# Patient Record
Sex: Female | Born: 1965 | Race: Black or African American | Hispanic: No | Marital: Single | Smoking: Never smoker
Health system: Southern US, Community
[De-identification: ages and names within clinical notes are randomized; demographics above are authoritative.]

## PROBLEM LIST (undated history)

## (undated) DIAGNOSIS — I10 Essential (primary) hypertension: Secondary | ICD-10-CM

## (undated) DIAGNOSIS — I509 Heart failure, unspecified: Secondary | ICD-10-CM

## (undated) DIAGNOSIS — E559 Vitamin D deficiency, unspecified: Secondary | ICD-10-CM

## (undated) HISTORY — PX: CHOLECYSTECTOMY: SHX55

## (undated) HISTORY — PX: ABDOMINAL HYSTERECTOMY: SHX81

## (undated) HISTORY — DX: Vitamin D deficiency, unspecified: E55.9

---

## 2018-09-14 ENCOUNTER — Encounter (HOSPITAL_COMMUNITY): Payer: Self-pay | Admitting: Emergency Medicine

## 2018-09-14 ENCOUNTER — Emergency Department (HOSPITAL_COMMUNITY): Payer: Medicaid Other

## 2018-09-14 ENCOUNTER — Emergency Department (HOSPITAL_COMMUNITY)
Admission: EM | Admit: 2018-09-14 | Discharge: 2018-09-14 | Disposition: A | Discharge status: Home / Self Care | Payer: Medicaid Other | Attending: Emergency Medicine | Admitting: Emergency Medicine

## 2018-09-14 DIAGNOSIS — R51 Headache: Secondary | ICD-10-CM | POA: Insufficient documentation

## 2018-09-14 DIAGNOSIS — R519 Headache, unspecified: Secondary | ICD-10-CM

## 2018-09-14 DIAGNOSIS — R079 Chest pain, unspecified: Secondary | ICD-10-CM | POA: Diagnosis present

## 2018-09-14 DIAGNOSIS — I110 Hypertensive heart disease with heart failure: Secondary | ICD-10-CM | POA: Diagnosis not present

## 2018-09-14 DIAGNOSIS — I509 Heart failure, unspecified: Secondary | ICD-10-CM | POA: Insufficient documentation

## 2018-09-14 DIAGNOSIS — R0789 Other chest pain: Secondary | ICD-10-CM

## 2018-09-14 DIAGNOSIS — I10 Essential (primary) hypertension: Secondary | ICD-10-CM

## 2018-09-14 HISTORY — DX: Heart failure, unspecified: I50.9

## 2018-09-14 HISTORY — DX: Essential (primary) hypertension: I10

## 2018-09-14 LAB — URINALYSIS, ROUTINE W REFLEX MICROSCOPIC
Bilirubin Urine: NEGATIVE
Glucose, UA: NEGATIVE mg/dL
Hgb urine dipstick: NEGATIVE
Ketones, ur: NEGATIVE mg/dL
Nitrite: NEGATIVE
Protein, ur: NEGATIVE mg/dL
Specific Gravity, Urine: 1.008 (ref 1.005–1.030)
pH: 9 — ABNORMAL HIGH (ref 5.0–8.0)

## 2018-09-14 LAB — BASIC METABOLIC PANEL
Anion gap: 10 (ref 5–15)
BUN: 9 mg/dL (ref 6–20)
CALCIUM: 9 mg/dL (ref 8.9–10.3)
CO2: 22 mmol/L (ref 22–32)
CREATININE: 0.59 mg/dL (ref 0.44–1.00)
Chloride: 110 mmol/L (ref 98–111)
GFR calc Af Amer: >60 mL/min (ref >60)
Glucose, Bld: 93 mg/dL (ref 70–99)
POTASSIUM: 4.1 mmol/L (ref 3.5–5.1)
Sodium: 142 mmol/L (ref 135–145)

## 2018-09-14 LAB — CBC
HCT: 32 % — ABNORMAL LOW (ref 36.0–46.0)
Hemoglobin: 10.3 g/dL — ABNORMAL LOW (ref 12.0–15.0)
MCH: 26.6 pg (ref 26.0–34.0)
MCHC: 32.2 g/dL (ref 30.0–36.0)
MCV: 82.7 fL (ref 78.0–100.0)
Platelets: 197 K/uL (ref 150–400)
RBC: 3.87 MIL/uL (ref 3.87–5.11)
RDW: 12.9 % (ref 11.5–15.5)
WBC: 4.8 K/uL (ref 4.0–10.5)

## 2018-09-14 LAB — I-STAT TROPONIN, ED: Troponin i, poc: 0 ng/mL (ref 0.00–0.08)

## 2018-09-14 MED ORDER — HYDRALAZINE HCL 20 MG/ML IJ SOLN
10.0000 mg | Freq: Once | INTRAMUSCULAR | Status: AC
  Administered 2018-09-14: 10 mg via INTRAVENOUS
  Filled 2018-09-14: qty 1

## 2018-09-14 MED ORDER — HYDROCHLOROTHIAZIDE 25 MG PO TABS: 25.0000 mg | ORAL_TABLET | Freq: Every day | ORAL | 0 refills | Status: AC

## 2018-09-14 NOTE — ED Notes (Signed)
Pt ambulatory to bathroom with no reported issues. 

## 2018-09-14 NOTE — ED Provider Notes (Signed)
[facility] EMERGENCY DEPARTMENT Provider Note   CSN: [id number] Arrival date & time: 09/14/18  1101     History   Chief Complaint Chief Complaint  Patient presents with  . Chest Pain    HPI Ashley Mitchell is a 52 y.o. female with history of hypertension and CHF presents for evaluation of acute onset, intermittent left-sided chest pains and headaches for 1 year.  She states that approximately twice weekly she will experience a sharp throbbing headache to the occipital region and sometimes the crown which radiates down the neck.  These will come on gradually and resolve on their own.  She also notes stabbing pain to the left side of the chest which she will experience for a few seconds before resolving.  She does note associated lightheadedness and diaphoresis from this.  Has not had chest pain for 2 weeks.  Went to urgent care earlier today for evaluation of left hand numbness and tingling and left-sided neck pain.  Sent to the ED for further evaluation because she was found to be hypertensive.  She states that she was prescribed amlodipine a year ago but has not been taking it because she does not like how it makes her feel.  Has also been on lisinopril but developed a cough and this was discontinued.  Presently denies any chest pain, headache, weakness.  No vision changes, fevers, chills, abdominal pain.    The history is provided by the patient.    Past Medical History:  Diagnosis Date  . CHF (congestive heart failure) (HCC)   . Hypertension     There are no active problems to display for this patient.   Past Surgical History:  Procedure Laterality Date  . ABDOMINAL HYSTERECTOMY    . CHOLECYSTECTOMY       OB History   None      Home Medications    Prior to Admission medications   Medication Sig Start Date End Date Taking? Authorizing Provider  hydrochlorothiazide (HYDRODIURIL) 25 MG tablet Take 1 tablet (25 mg total) by mouth daily. 09/14/18 10/14/18   Jeanie Sewer, PA-C    Family History History reviewed. No pertinent family history.  Social History Social History   Tobacco Use  . Smoking status: Never Smoker  . Smokeless tobacco: Never Used  Substance Use Topics  . Alcohol use: Never    Frequency: Never  . Drug use: Never     Allergies   Aspirin; Lisinopril; and Vicodin [hydrocodone-acetaminophen]   Review of Systems Review of Systems  Constitutional: Negative for chills and fever.  Respiratory: Negative for shortness of breath.   Cardiovascular: Negative for chest pain.  Gastrointestinal: Negative for abdominal pain, nausea and vomiting.  Musculoskeletal: Negative for back pain and neck pain.  Neurological: Positive for numbness (left hand) and headaches. Negative for weakness.  All other systems reviewed and are negative.    Physical Exam Updated Vital Signs BP (!) 169/82   Pulse 68   Temp 98.4 F (36.9 C) (Oral)   Resp 18   Ht 5' (1.524 m)   Wt 63 kg   SpO2 100%   BMI 27.15 kg/m   Physical Exam  Constitutional: She is oriented to person, place, and time. She appears well-developed and well-nourished. No distress.  HENT:  Head: Normocephalic and atraumatic.  Eyes: Conjunctivae are normal. Right eye exhibits no discharge. Left eye exhibits no discharge.  Neck: Normal range of motion. Neck supple. No JVD present. No tracheal deviation present.  No midline  spine tenderness, left-sided para cervical muscle tenderness in the trapezius distribution noted.  No deformity, crepitus, or step-off noted.  Cardiovascular: Normal rate and regular rhythm.  Pulses:      Carotid pulses are 2+ on the right side, and 2+ on the left side.      Radial pulses are 2+ on the right side, and 2+ on the left side.       Dorsalis pedis pulses are 2+ on the right side, and 2+ on the left side.       Posterior tibial pulses are 2+ on the right side, and 2+ on the left side.  Pulmonary/Chest: Effort normal and breath sounds  normal. No accessory muscle usage. No respiratory distress.  Abdominal: Soft. Bowel sounds are normal. She exhibits no distension. There is no tenderness.  Musculoskeletal: She exhibits no edema.       Right lower leg: Normal. She exhibits no tenderness and no edema.       Left lower leg: Normal. She exhibits no tenderness and no edema.  Neurological: She is alert and oriented to person, place, and time. No cranial nerve deficit.  Mental Status:  Alert, thought content appropriate, able to give a coherent history. Speech fluent without evidence of aphasia. Able to follow 2 step commands without difficulty.  Cranial Nerves:  II:  Peripheral visual fields grossly normal, pupils equal, round, reactive to light III,IV, VI: ptosis not present, extra-ocular motions intact bilaterally  V,VII: smile symmetric, facial light touch sensation equal VIII: hearing grossly normal to voice  X: uvula elevates symmetrically  XI: bilateral shoulder shrug symmetric and strong XII: midline tongue extension without fassiculations Motor:  Normal tone. 5/5 strength of BUE and BLE major muscle groups including strong and equal grip strength and dorsiflexion/plantar flexion Sensory: light touch normal in all extremities. Cerebellar: normal finger-to-nose with bilateral upper extremities Gait: normal gait and balance. Able to walk on toes and heels with ease.     Skin: Skin is dry. No erythema.  Psychiatric: She has a normal mood and affect. Her behavior is normal.  Nursing note and vitals reviewed.    ED Treatments / Results  Labs (all labs ordered are listed, but only abnormal results are displayed) Labs Reviewed  CBC - Abnormal; Notable for the following components:      Result Value   Hemoglobin 10.3 (*)    HCT 32.0 (*)    All other components within normal limits  URINALYSIS, ROUTINE W REFLEX MICROSCOPIC - Abnormal; Notable for the following components:   Color, Urine STRAW (*)    pH 9.0 (*)     Leukocytes, UA TRACE (*)    Bacteria, UA RARE (*)    All other components within normal limits  URINE CULTURE  BASIC METABOLIC PANEL  I-STAT TROPONIN, ED    EKG EKG Interpretation  Date/Time:  Monday September 14 2018 11:15:13 EDT Ventricular Rate:  56 PR Interval:    QRS Duration: 87 QT Interval:  439 QTC Calculation: 424 R Axis:   52 Text Interpretation:  Sinus rhythm Consider left atrial enlargement Nonspecific T abnrm, anterolateral leads ST elev, probable normal early repol pattern Baseline wander in lead(s) V4 V5 V6 Abnormal ekg Confirmed by Gerhard Munch [phone]) on 09/14/2018 11:20:02 AM Also confirmed by Gerhard Munch ([id number]), editor Hermleigh, Tamera Punt ([id number])  on 09/14/2018 3:06:47 PM   Radiology Dg Chest 2 View  Result Date: 09/14/2018 CLINICAL DATA:  Chest and lt arm pain since early am,hypertensive EXAM: CHEST - 2 VIEW  COMPARISON:  None. FINDINGS: Heart size is upper normal. Overall cardiomediastinal silhouette is within normal limits. Lungs are clear. No pleural effusion or pneumothorax seen. Osseous structures about the chest are unremarkable. IMPRESSION: No active cardiopulmonary disease. No evidence of pneumonia or pulmonary edema. Electronically Signed   By: Bary RichardStan  Maynard M.D.   On: 09/14/2018 13:08    Procedures Procedures (including critical care time)  Medications Ordered in ED Medications  hydrALAZINE (APRESOLINE) injection 10 mg (10 mg Intravenous Given 09/14/18 1153)     Initial Impression / Assessment and Plan / ED Course  I have reviewed the triage vital signs and the nursing notes.  Pertinent labs & imaging results that were available during my care of the patient were reviewed by me and considered in my medical decision making (see chart for details).     Patient presents for evaluation of hypertension.  She has had intermittent chest pains and headaches for the past year.  She is afebrile, hypertensive in the ED with improvement after  administration of hydralazine.  No focal neurologic deficits on examination and she denies any headache or chest pain at this time.  Has not experienced chest pain in the past 2 weeks.  Lab work reviewed by me shows no leukocytosis, mild anemia.  No metabolic derangements, creatinine within normal limits.  Troponin is negative.  Since she has not experienced chest pain for 2 weeks now I do not see the need to repeat troponin.  Doubt ACS/MI.  EKG shows normal sinus rhythm, no ischemic changes noted.  Chest x-ray shows no active cardiopulmonary disease or evidence of pneumonia or edema.  UA shows trace leukocytes and rare bacteria.  She has no urinary symptoms.  Will culture urine and elect to hold off on treatment of possible UTI at this time. No evidence of end-organ damage. Doubt CVA, ICH, SAH, or AKI.  Significant improvement in blood pressure after hydralazine.  She has had lisinopril and amlodipine which she has not tolerated in the past.  States she has not been on a blood pressure medication for several years.  Will discharge with HCTZ.  She has follow-up scheduled with a PCP in 1 week.  Discussed strict ED return precautions. Pt and patient's cousin verbalized understanding of and agreement with plan and patient is safe for discharge home at this time.  Patient seen and evaluated by Dr. Jeraldine LootsLockwood who agrees with assessment and plan at this time.  Final Clinical Impressions(s) / ED Diagnoses   Final diagnoses:  Atypical chest pain  Intermittent headache  Hypertension, unspecified type    ED Discharge Orders         Ordered    hydrochlorothiazide (HYDRODIURIL) 25 MG tablet  Daily     09/14/18 1453           Jeanie SewerFawze, Alban Marucci A, PA-C 09/14/18 1549    Gerhard MunchLockwood, Robert, MD 09/14/18 1605

## 2018-09-14 NOTE — Discharge Instructions (Signed)
Start taking hydrochlorothiazide as prescribed.  Drink plenty water and get plenty of rest.  Follow-up with a primary care physician for reevaluation of your symptoms.  Return to the emergency department if any concerning signs or symptoms develop such as severe headaches, vision changes, persistent shortness of breath or chest pain, weakness, abdominal pain, or persistent vomiting.

## 2018-09-14 NOTE — ED Notes (Signed)
Discharge instructions and prescriptions discussed with Pt. Pt verbalized understanding. Pt stable and ambulatory.   

## 2018-09-14 NOTE — ED Triage Notes (Signed)
Pt here via [facility] from urgent care c/o CP radiating to left arm and shoulder since yesterday at 1000. When EMS arrived pt was only complaining of a migraine.  Pt has been prescribed amlodipine a year ago when she was last seen by a dr but hasn't been taking it.  BP 220/110, 60 P, 100% RA.

## 2018-09-15 LAB — URINE CULTURE: Culture: <10000 — AB

## 2019-07-14 IMAGING — DX DG CHEST 2V
2 series · 2 of 2 positions shown · non-contrast
Comparison: None.

CLINICAL DATA: Chest and lt arm pain since early am,hypertensive

EXAM:
CHEST - 2 VIEW

[chest lat]
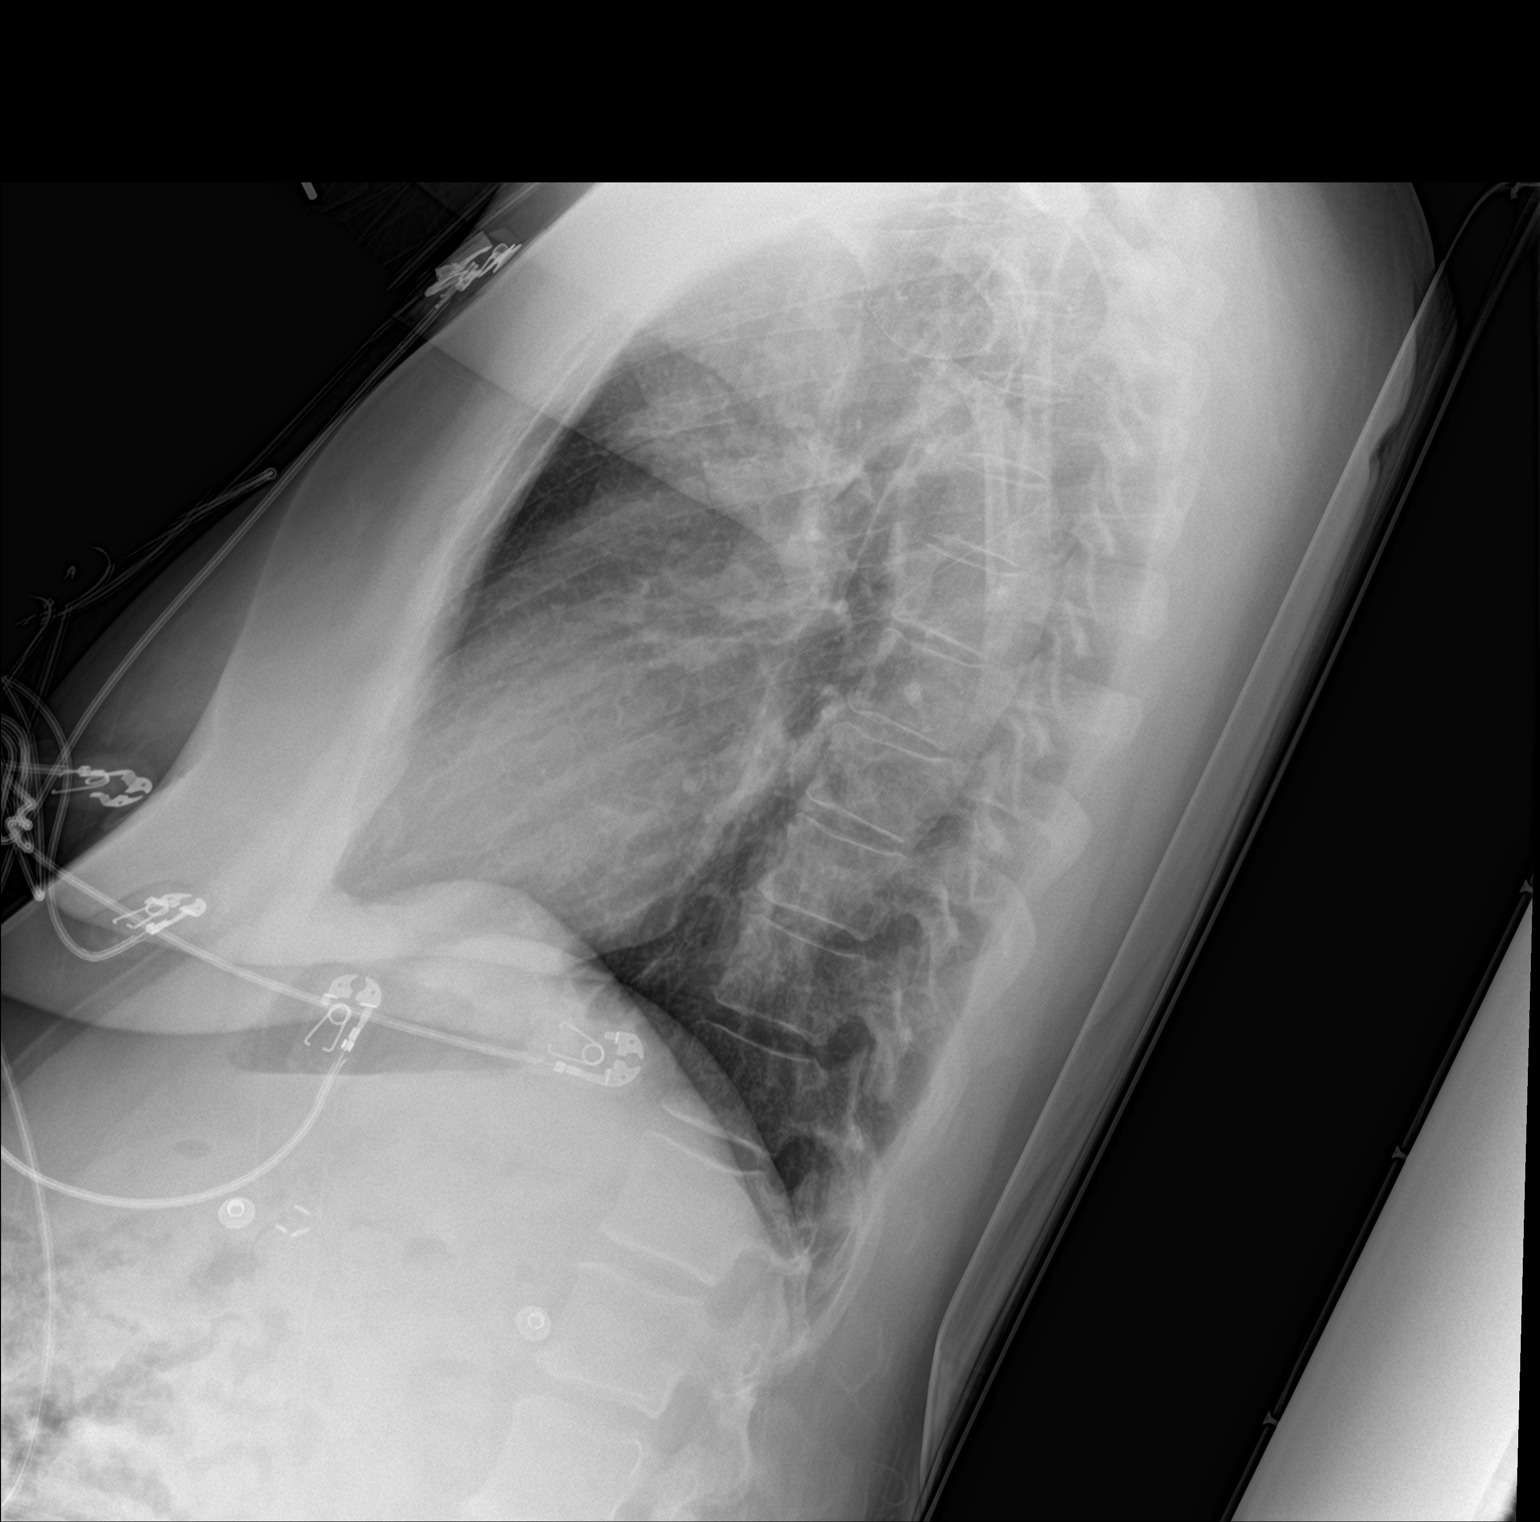

[chest ap]
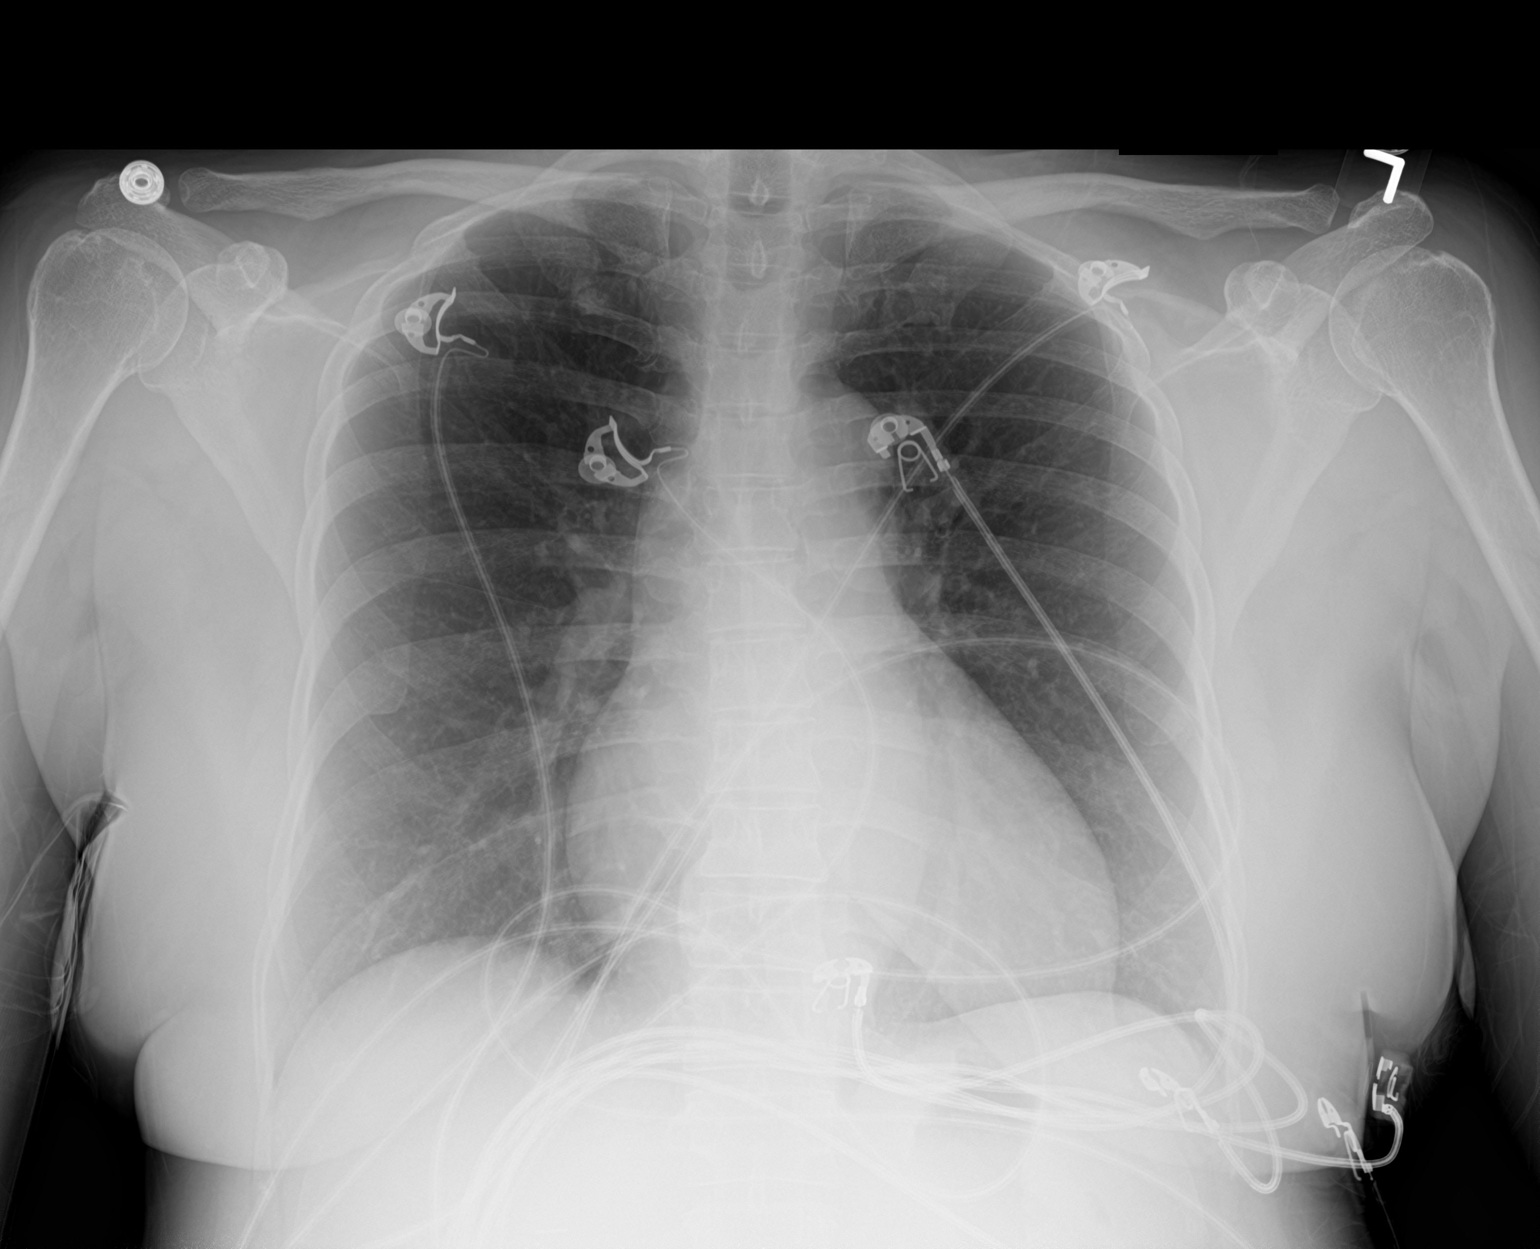

[2 of 2 positions shown; findings below may reference images not displayed]

FINDINGS: Heart size is upper normal. Overall cardiomediastinal silhouette is
within normal limits. Lungs are clear. No pleural effusion or
pneumothorax seen. Osseous structures about the chest are
unremarkable.
IMPRESSION: No active cardiopulmonary disease. No evidence of pneumonia or
pulmonary edema.

## 2021-02-08 ENCOUNTER — Other Ambulatory Visit: Payer: Self-pay | Admitting: Internal Medicine

## 2021-02-08 DIAGNOSIS — G44209 Tension-type headache, unspecified, not intractable: Secondary | ICD-10-CM

## 2022-01-25 ENCOUNTER — Telehealth: Payer: Self-pay | Admitting: Physician Assistant

## 2022-01-25 NOTE — Telephone Encounter (Signed)
Scheduled appt per 2/2 referral. Pt is aware of appt date and time. Pt is aware to arrive 15 mins prior to appt time.  °

## 2022-02-13 ENCOUNTER — Inpatient Hospital Stay: Payer: Medicaid Other

## 2022-02-13 ENCOUNTER — Inpatient Hospital Stay: Payer: Medicaid Other | Attending: Physician Assistant | Admitting: Physician Assistant

## 2022-02-13 ENCOUNTER — Other Ambulatory Visit: Payer: Self-pay

## 2022-02-13 ENCOUNTER — Encounter: Payer: Self-pay | Admitting: Physician Assistant

## 2022-02-13 VITALS — BP 128/72 | HR 77 | Temp 97.7°F | Resp 16 | Wt 140.1 lb

## 2022-02-13 DIAGNOSIS — D649 Anemia, unspecified: Secondary | ICD-10-CM

## 2022-02-13 DIAGNOSIS — E559 Vitamin D deficiency, unspecified: Secondary | ICD-10-CM | POA: Diagnosis not present

## 2022-02-13 LAB — FERRITIN: Ferritin: 80 ng/mL (ref 11–307)

## 2022-02-13 LAB — RETIC PANEL
Immature Retic Fract: 10.3 % (ref 2.3–15.9)
RBC.: 4.06 MIL/uL (ref 3.87–5.11)
Retic Count, Absolute: 61.3 10*3/uL (ref 19.0–186.0)
Retic Ct Pct: 1.5 % (ref 0.4–3.1)
Reticulocyte Hemoglobin: 29.7 pg (ref >27.9)

## 2022-02-13 LAB — CMP (CANCER CENTER ONLY)
ALT: 11 U/L (ref 0–44)
AST: 16 U/L (ref 15–41)
Albumin: 4.4 g/dL (ref 3.5–5.0)
Alkaline Phosphatase: 90 U/L (ref 38–126)
Anion gap: 6 (ref 5–15)
BUN: 15 mg/dL (ref 6–20)
CO2: 33 mmol/L — ABNORMAL HIGH (ref 22–32)
Calcium: 9.9 mg/dL (ref 8.9–10.3)
Chloride: 106 mmol/L (ref 98–111)
Creatinine: 0.8 mg/dL (ref 0.44–1.00)
GFR, Estimated: >60 mL/min (ref >60)
Glucose, Bld: 109 mg/dL — ABNORMAL HIGH (ref 70–99)
Potassium: 3.1 mmol/L — ABNORMAL LOW (ref 3.5–5.1)
Sodium: 145 mmol/L (ref 135–145)
Total Bilirubin: 0.3 mg/dL (ref 0.3–1.2)
Total Protein: 7.6 g/dL (ref 6.5–8.1)

## 2022-02-13 LAB — CBC WITH DIFFERENTIAL (CANCER CENTER ONLY)
Abs Immature Granulocytes: 0.02 10*3/uL (ref 0.00–0.07)
Basophils Absolute: 0 10*3/uL (ref 0.0–0.1)
Basophils Relative: 0 %
Eosinophils Absolute: 0.1 10*3/uL (ref 0.0–0.5)
Eosinophils Relative: 2 %
HCT: 33.1 % — ABNORMAL LOW (ref 36.0–46.0)
Hemoglobin: 10.9 g/dL — ABNORMAL LOW (ref 12.0–15.0)
Immature Granulocytes: 0 %
Lymphocytes Relative: 36 %
Lymphs Abs: 1.9 10*3/uL (ref 0.7–4.0)
MCH: 26.7 pg (ref 26.0–34.0)
MCHC: 32.9 g/dL (ref 30.0–36.0)
MCV: 81.1 fL (ref 80.0–100.0)
Monocytes Absolute: 0.2 10*3/uL (ref 0.1–1.0)
Monocytes Relative: 4 %
Neutro Abs: 3 10*3/uL (ref 1.7–7.7)
Neutrophils Relative %: 58 %
Platelet Count: 256 10*3/uL (ref 150–400)
RBC: 4.08 MIL/uL (ref 3.87–5.11)
RDW: 12.8 % (ref 11.5–15.5)
WBC Count: 5.3 10*3/uL (ref 4.0–10.5)
nRBC: 0 % (ref 0.0–0.2)

## 2022-02-13 LAB — SAVE SMEAR(SSMR), FOR PROVIDER SLIDE REVIEW

## 2022-02-13 LAB — VITAMIN B12: Vitamin B-12: 329 pg/mL (ref 180–914)

## 2022-02-13 LAB — IRON AND IRON BINDING CAPACITY (CC-WL,HP ONLY)
Iron: 82 ug/dL (ref 28–170)
Saturation Ratios: 28 % (ref 10.4–31.8)
TIBC: 293 ug/dL (ref 250–450)
UIBC: 211 ug/dL (ref 148–442)

## 2022-02-13 LAB — FOLATE: Folate: 10.8 ng/mL (ref >5.9)

## 2022-02-13 NOTE — Progress Notes (Deleted)
[facility] [phone]   [phone]  INITIAL CONSULT NOTE  Patient Care Team: Salli Real, MD as PCP - General (Internal Medicine)  Hematological/Oncological History 1) Labs from PCP, Dr. Salli Real -06/02/2021: WBC 5.1, Hgb 10.9 (L), MCV 81 (L), Plt 279, Ferritin 146, Iron 167, TIBC 287, Folate 15.27, Vitamin B12 1663.   2) 02/13/2022: Establish care with Georga Kaufmann PA-C  CHIEF COMPLAINTS/PURPOSE OF CONSULTATION:  Anemia  HISTORY OF PRESENTING ILLNESS:  Laura Snyder 56 y.o. female with medical history significant for ***  On review of the previous records ***  On exam today ***  MEDICAL HISTORY:  No past medical history on file.  SURGICAL HISTORY: *** The histories are not reviewed yet. Please review them in the "History" navigator section and refresh this SmartLink.  SOCIAL HISTORY: Social History   Socioeconomic History   Marital status: Single    Spouse name: Not on file   Number of children: Not on file   Years of education: Not on file   Highest education level: Not on file  Occupational History   Not on file  Tobacco Use   Smoking status: Not on file   Smokeless tobacco: Not on file  Substance and Sexual Activity   Alcohol use: Not on file   Drug use: Not on file   Sexual activity: Not on file  Other Topics Concern   Not on file  Social History Narrative   Not on file   Social Determinants of Health   Financial Resource Strain: Not on file  Food Insecurity: Not on file  Transportation Needs: Not on file  Physical Activity: Not on file  Stress: Not on file  Social Connections: Not on file  Intimate Partner Violence: Not on file    FAMILY HISTORY: No family history on file.  ALLERGIES:  is allergic to aspirin, codeine, and hydrocodone.  MEDICATIONS:  No current outpatient medications on file.   No current facility-administered medications for this visit.    REVIEW OF SYSTEMS:   Constitutional: ( - )  fevers, ( - )  chills , ( - ) night sweats Eyes: ( - ) blurriness of vision, ( - ) double vision, ( - ) watery eyes Ears, nose, mouth, throat, and face: ( - ) mucositis, ( - ) sore throat Respiratory: ( - ) cough, ( - ) dyspnea, ( - ) wheezes Cardiovascular: ( - ) palpitation, ( - ) chest discomfort, ( - ) lower extremity swelling Gastrointestinal:  ( - ) nausea, ( - ) heartburn, ( - ) change in bowel habits Skin: ( - ) abnormal skin rashes Lymphatics: ( - ) new lymphadenopathy, ( - ) easy bruising Neurological: ( - ) numbness, ( - ) tingling, ( - ) new weaknesses Behavioral/Psych: ( - ) mood change, ( - ) new changes  All other systems were reviewed with the patient and are negative.  PHYSICAL EXAMINATION: ECOG PERFORMANCE STATUS: {CHL ONC ECOG JF:[id number]}  Vitals:   02/13/22 1117  BP: 128/72  Pulse: 77  Resp: 16  Temp: 97.7 F (36.5 C)  SpO2: 99%   Filed Weights   02/13/22 1117  Weight: 140 lb 1.6 oz (63.5 kg)    GENERAL: well appearing *** in NAD  SKIN: skin color, texture, turgor are normal, no rashes or significant lesions EYES: conjunctiva are pink and non-injected, sclera clear OROPHARYNX: no exudate, no erythema; lips, buccal mucosa, and tongue normal  NECK: supple, non-tender LYMPH:  no palpable lymphadenopathy  in the cervical, axillary or supraclavicular lymph nodes.  LUNGS: clear to auscultation and percussion with normal breathing effort HEART: regular rate & rhythm and no murmurs and no lower extremity edema ABDOMEN: soft, non-tender, non-distended, normal bowel sounds Musculoskeletal: no cyanosis of digits and no clubbing  PSYCH: alert & oriented x 3, fluent speech NEURO: no focal motor/sensory deficits  LABORATORY DATA:  I have reviewed the data as listed No flowsheet data found.  No flowsheet data found.   PATHOLOGY: ***  BLOOD FILM: *** Review of the peripheral blood smear showed normal appearing white cells with neutrophils that were  appropriately lobated and granulated. There was no predominance of bi-lobed or hyper-segmented neutrophils appreciated. No Dohle bodies were noted. There was no left shifting, immature forms or blasts noted. Lymphocytes remain normal in size without any predominance of large granular lymphocytes. Red cells show no anisopoikilocytosis, macrocytes , microcytes or polychromasia. There were no schistocytes, target cells, echinocytes, acanthocytes, dacrocytes, or stomatocytes.There was no rouleaux formation, nucleated red cells, or intra-cellular inclusions noted. The platelets are normal in size, shape, and color without any clumping evident.  RADIOGRAPHIC STUDIES: I have personally reviewed the radiological images as listed and agreed with the findings in the report. No results found.  ASSESSMENT & PLAN ***  No orders of the defined types were placed in this encounter.   All questions were answered. The patient knows to call the clinic with any problems, questions or concerns.  I have spent a total of {CHL ONC TIME VISIT - [id number]} minutes of face-to-face and non-face-to-face time, preparing to see the patient, obtaining and/or reviewing separately obtained history, performing a medically appropriate examination, counseling and educating the patient, ordering medications/tests/procedures, referring and communicating with other health care professionals, documenting clinical information in the electronic health record, independently interpreting results and communicating results to the patient, and care coordination.   Georga Kaufmann, PA-C Department of Hematology/Oncology [facility] at [facility] Phone: [phone]

## 2022-02-13 NOTE — Progress Notes (Signed)
[place] [phone]   [phone]  INITIAL CONSULT NOTE  Patient Care Team: Sandi Mariscal, MD as PCP - General (Internal Medicine)  Hematological/Oncological History 1) Labs from PCP, Dr. Sandi Mariscal -06/02/2021: WBC 5.1, Hgb 10.9 (L), MCV 81, Plt 279, Ferritin 146, Iron 167, TIBC 287, Folate 15.27, Vitamin B12 1663.   2) 02/13/2022: Establish care with Dede Query PA-C  CHIEF COMPLAINTS/PURPOSE OF CONSULTATION:  Normocytic anemia  HISTORY OF PRESENTING ILLNESS:  Laura Snyder 56 y.o. female with medical history significant for vitamin D deficiency. She is unaccompanied for this visit.   On exam today, Laura Snyder reports her energy levels are fairly stable. She had occasional episodes of fatigue but she continues to complete all her daily activities on her own. She has a good appetite without any appetite restrictions. She denies any nausea, vomiting or abdominal pain. She reports constipation with a bowel movement every 2-3 days. She takes a laxative as needed with improvement of symptoms. She denies easy bruising or signs of active bleeding. This includes no evidence of hematochezia or melena. Patient reports shortness of breath with exertion. She reports dizziness when she bends over but denies any syncopal episodes. She reports chronic right lower extremity pain that radiates from her low back toward her right thigh with associated numbness. She denies any difficulty with ambulation or gait. She denies fevers, chills, night sweats, chest pain or cough. She has no other complaints. Rest of the 10 point ROS is below.   MEDICAL HISTORY:  Past Medical History:  Diagnosis Date   Vitamin D deficiency     SURGICAL HISTORY: Past Surgical History:  Procedure Laterality Date   ABDOMINAL HYSTERECTOMY     fibroids   CESAREAN SECTION     CHOLECYSTECTOMY      SOCIAL HISTORY: Social History   Socioeconomic History   Marital status: Single    Spouse name:  Not on file   Number of children: Not on file   Years of education: Not on file   Highest education level: Not on file  Occupational History   Not on file  Tobacco Use   Smoking status: Never   Smokeless tobacco: Never  Substance and Sexual Activity   Alcohol use: Never   Drug use: Never   Sexual activity: Not on file  Other Topics Concern   Not on file  Social History Narrative   Not on file   Social Determinants of Health   Financial Resource Strain: Not on file  Food Insecurity: Not on file  Transportation Needs: Not on file  Physical Activity: Not on file  Stress: Not on file  Social Connections: Not on file  Intimate Partner Violence: Not on file    FAMILY HISTORY: Family History  Problem Relation Age of Onset   Pancreatic cancer Father     ALLERGIES:  is allergic to aspirin, codeine, and hydrocodone.  MEDICATIONS:  Current Outpatient Medications  Medication Sig Dispense Refill   hydrochlorothiazide (HYDRODIURIL) 25 MG tablet Take 25 mg by mouth daily.     Vitamin D, Ergocalciferol, (DRISDOL) 1.25 MG (50000 UNIT) CAPS capsule Take 50,000 Units by mouth once a week.     No current facility-administered medications for this visit.    REVIEW OF SYSTEMS:   Constitutional: ( - ) fevers, ( - )  chills , ( - ) night sweats Eyes: ( - ) blurriness of vision, ( - ) double vision, ( - ) watery eyes Ears, nose, mouth, throat,  and face: ( - ) mucositis, ( - ) sore throat Respiratory: ( - ) cough, ( + ) dyspnea, ( - ) wheezes Cardiovascular: ( - ) palpitation, ( - ) chest discomfort, ( - ) lower extremity swelling Gastrointestinal:  ( - ) nausea, ( - ) heartburn, ( - ) change in bowel habits Skin: ( - ) abnormal skin rashes Lymphatics: ( - ) new lymphadenopathy, ( - ) easy bruising Neurological: ( - ) numbness, ( - ) tingling, ( - ) new weaknesses Behavioral/Psych: ( - ) mood change, ( - ) new changes  All other systems were reviewed with the patient and are  negative.  PHYSICAL EXAMINATION: ECOG PERFORMANCE STATUS: 1 - Symptomatic but completely ambulatory  Vitals:   02/13/22 1117  BP: 128/72  Pulse: 77  Resp: 16  Temp: 97.7 F (36.5 C)  SpO2: 99%   Filed Weights   02/13/22 1117  Weight: 140 lb 1.6 oz (63.5 kg)    GENERAL: well appearing female in NAD  SKIN: skin color, texture, turgor are normal, no rashes or significant lesions EYES: conjunctiva are pink and non-injected, sclera clear OROPHARYNX: no exudate, no erythema; lips, buccal mucosa, and tongue normal  NECK: supple, non-tender LYMPH:  no palpable lymphadenopathy in the cervical or supraclavicular lymph nodes.  LUNGS: clear to auscultation and percussion with normal breathing effort HEART: regular rate & rhythm and no murmurs and no lower extremity edema ABDOMEN: soft, non-tender, non-distended, normal bowel sounds Musculoskeletal: no cyanosis of digits and no clubbing  PSYCH: alert & oriented x 3, fluent speech NEURO: no focal motor/sensory deficits  LABORATORY DATA:  I have reviewed the data as listed CBC Latest Ref Rng & Units 02/13/2022  WBC 4.0 - 10.5 K/uL 5.3  Hemoglobin 12.0 - 15.0 g/dL 10.9(L)  Hematocrit 36.0 - 46.0 % 33.1(L)  Platelets 150 - 400 K/uL 256    CMP Latest Ref Rng & Units 02/13/2022  Glucose 70 - 99 mg/dL 109(H)  BUN 6 - 20 mg/dL 15  Creatinine 0.44 - 1.00 mg/dL 0.80  Sodium 135 - 145 mmol/L 145  Potassium 3.5 - 5.1 mmol/L 3.1(L)  Chloride 98 - 111 mmol/L 106  CO2 22 - 32 mmol/L 33(H)  Calcium 8.9 - 10.3 mg/dL 9.9  Total Protein 6.5 - 8.1 g/dL 7.6  Total Bilirubin 0.3 - 1.2 mg/dL 0.3  Alkaline Phos 38 - 126 U/L 90  AST 15 - 41 U/L 16  ALT 0 - 44 U/L 11    RADIOGRAPHIC STUDIES: I have personally reviewed the radiological images as listed and agreed with the findings in the report. No results found.  ASSESSMENT & PLAN Laura Snyder is a 56 y.o. female who presents for initial evaluation for normocytic anemia. We reviewed possible  etiologies including nutritional deficiencies, iron deficiency, paraproteinemia, chronic kidney disease, hemoglobinopathies, hemolysis and bone marrow disorders.   Patient will proceed with baseline laboratory evaluation to check CBC, CMP, Save Smear, iron panel, vitamin B12, methylmalonic acid, folate, SPEP with IFE and retic panel.   #Normocytic anemia --Etiology unknown --Labs today to check CBC, CMP, Save Smear, iron panel, vitamin B12, methylmalonic acid, folate, SPEP with IFE and retic panel.  --If serologic workup is negative, we will request a bone marrow biopsy --RTC after workup is complete   Orders Placed This Encounter  Procedures   CBC with Differential ([place] Only)    Standing Status:   Future    Number of Occurrences:   1    Standing  Expiration Date:   02/12/2023   CMP ([facility] only)    Standing Status:   Future    Number of Occurrences:   1    Standing Expiration Date:   02/12/2023   Retic Panel    Standing Status:   Future    Number of Occurrences:   1    Standing Expiration Date:   02/12/2023   Ferritin    Standing Status:   Future    Number of Occurrences:   1    Standing Expiration Date:   02/12/2023   Iron and Iron Binding Capacity ([facility] only)    Standing Status:   Future    Number of Occurrences:   1    Standing Expiration Date:   02/12/2023   Vitamin B12    Standing Status:   Future    Number of Occurrences:   1    Standing Expiration Date:   02/12/2023   Methylmalonic acid, serum    Standing Status:   Future    Number of Occurrences:   1    Standing Expiration Date:   02/12/2023   Folate, Serum    Standing Status:   Future    Number of Occurrences:   1    Standing Expiration Date:   02/12/2023   Multiple Myeloma Panel (SPEP&IFE w/QIG)    Standing Status:   Future    Number of Occurrences:   1    Standing Expiration Date:   02/13/2023   Save Smear [facility])    Standing Status:   Future    Number of Occurrences:   1    Standing  Expiration Date:   02/13/2023    All questions were answered. The patient knows to call the clinic with any problems, questions or concerns.  I have spent a total of 60 minutes minutes of face-to-face and non-face-to-face time, preparing to see the patient, obtaining and/or reviewing separately obtained history, performing a medically appropriate examination, counseling and educating the patient, ordering tests, referring and communicating with other health care professionals, documenting clinical information in the electronic health record, and care coordination.   Dede Query, PA-C Department of Hematology/Oncology [place] at [facility] Phone: [phone]  Patient was seen with Dr. Lorenso Courier  I have read the above note and personally examined the patient. I agree with the assessment and plan as noted above.  Briefly Laura Snyder is a 56 year old female who presents for evaluation of normocytic anemia of unclear etiology.  Her labs today show a hemoglobin of 10.9, white blood cell count 5.3, MCV of 81.1, and platelets of 256.  In order to further evaluate this we will look into full nutritional panels with iron, vitamin B12, and folate.  Patient does not have any kidney dysfunction or any chronic disease.  If no clear etiology can be found with her initial blood work would recommend consideration of bone marrow biopsy.  Plan have the patient return to clinic pending the results of the above studies.   Ledell Peoples, MD Department of Hematology/Oncology [place] at [facility] Phone: [phone] Pager: [phone] Email: [email or web address]

## 2022-02-19 LAB — MULTIPLE MYELOMA PANEL, SERUM
Albumin SerPl Elph-Mcnc: 3.9 g/dL (ref 2.9–4.4)
Albumin/Glob SerPl: 1.2 (ref 0.7–1.7)
Alpha 1: 0.2 g/dL (ref 0.0–0.4)
Alpha2 Glob SerPl Elph-Mcnc: 0.7 g/dL (ref 0.4–1.0)
B-Globulin SerPl Elph-Mcnc: 1.2 g/dL (ref 0.7–1.3)
Gamma Glob SerPl Elph-Mcnc: 1.3 g/dL (ref 0.4–1.8)
Globulin, Total: 3.4 g/dL (ref 2.2–3.9)
IgA: 374 mg/dL — ABNORMAL HIGH (ref 87–352)
IgG (Immunoglobin G), Serum: 1273 mg/dL (ref 586–1602)
IgM (Immunoglobulin M), Srm: 113 mg/dL (ref 26–217)
Total Protein ELP: 7.3 g/dL (ref 6.0–8.5)

## 2022-02-27 LAB — METHYLMALONIC ACID, SERUM: Methylmalonic Acid, Quantitative: 162 nmol/L (ref 0–378)

## 2022-02-28 ENCOUNTER — Telehealth: Payer: Self-pay | Admitting: Physician Assistant

## 2022-02-28 NOTE — Telephone Encounter (Signed)
Labs mailed to pt per Irene's request ?

## 2022-02-28 NOTE — Telephone Encounter (Signed)
I called [name] to review the finalized lab results from 02/13/2022.  Findings continue to show normocytic anemia with a hemoglobin of 10.9.  Work-up ruled out vitamin B12, folate and iron deficiency.  Additionally there is no evidence of monoclonal protein seen on SPEP.  Discussed that since her work-up is negative we would recommend pursuing a bone marrow biopsy to further evaluate for any bone marrow disorders. ? ?Patient did not want to pursue a bone marrow biopsy at this time.  She says that she feels well and would like to monitor the hemoglobin levels.  I did explain that without knowing the underlying cause, patient's anemia can get worse which will eventually cause her symptoms.  Patient acknowledged my recommendations but would like to proceed with observation. ? ?We will see the patient back in approximately 3 months to monitor with repeat labs. She is aware to reach back to the clinic if she changes her mind regarding further workup or she develops any new/worsening symptoms.  ?

## 2022-05-14 ENCOUNTER — Inpatient Hospital Stay: Payer: Medicaid Other

## 2022-05-14 ENCOUNTER — Inpatient Hospital Stay: Payer: Medicaid Other | Admitting: Physician Assistant
